# Patient Record
Sex: Male | Born: 1950 | Race: Black or African American | Hispanic: No | Marital: Single | Smoking: Never smoker
Health system: Southern US, Community
[De-identification: ages and names within clinical notes are randomized; demographics above are authoritative.]

## PROBLEM LIST (undated history)

## (undated) DIAGNOSIS — N400 Enlarged prostate without lower urinary tract symptoms: Secondary | ICD-10-CM

## (undated) DIAGNOSIS — F79 Unspecified intellectual disabilities: Secondary | ICD-10-CM

## (undated) DIAGNOSIS — E039 Hypothyroidism, unspecified: Secondary | ICD-10-CM

## (undated) DIAGNOSIS — D649 Anemia, unspecified: Secondary | ICD-10-CM

## (undated) DIAGNOSIS — H547 Unspecified visual loss: Secondary | ICD-10-CM

## (undated) DIAGNOSIS — E785 Hyperlipidemia, unspecified: Secondary | ICD-10-CM

## (undated) DIAGNOSIS — F0390 Unspecified dementia without behavioral disturbance: Secondary | ICD-10-CM

## (undated) DIAGNOSIS — F0280 Dementia in other diseases classified elsewhere without behavioral disturbance: Secondary | ICD-10-CM

## (undated) DIAGNOSIS — E559 Vitamin D deficiency, unspecified: Secondary | ICD-10-CM

## (undated) HISTORY — DX: Hyperlipidemia, unspecified: E78.5

## (undated) HISTORY — DX: Anemia, unspecified: D64.9

## (undated) HISTORY — DX: Dementia in other diseases classified elsewhere, unspecified severity, without behavioral disturbance, psychotic disturbance, mood disturbance, and anxiety: F02.80

## (undated) HISTORY — DX: Unspecified intellectual disabilities: F79

## (undated) HISTORY — DX: Hypothyroidism, unspecified: E03.9

## (undated) HISTORY — DX: Vitamin D deficiency, unspecified: E55.9

## (undated) HISTORY — DX: Unspecified visual loss: H54.7

---

## 2019-12-16 ENCOUNTER — Emergency Department (HOSPITAL_COMMUNITY): Payer: Medicare Other

## 2019-12-16 ENCOUNTER — Encounter (HOSPITAL_COMMUNITY): Payer: Self-pay

## 2019-12-16 ENCOUNTER — Observation Stay (HOSPITAL_COMMUNITY)
Admission: EM | Admit: 2019-12-16 | Discharge: 2019-12-16 | Disposition: A | Discharge status: Skilled Nursing Facility | Payer: Medicare Other | Attending: Family Medicine | Admitting: Family Medicine

## 2019-12-16 ENCOUNTER — Other Ambulatory Visit: Payer: Self-pay

## 2019-12-16 DIAGNOSIS — T43215A Adverse effect of selective serotonin and norepinephrine reuptake inhibitors, initial encounter: Secondary | ICD-10-CM | POA: Insufficient documentation

## 2019-12-16 DIAGNOSIS — Z79890 Hormone replacement therapy (postmenopausal): Secondary | ICD-10-CM | POA: Insufficient documentation

## 2019-12-16 DIAGNOSIS — F0151 Vascular dementia with behavioral disturbance: Secondary | ICD-10-CM | POA: Diagnosis not present

## 2019-12-16 DIAGNOSIS — G9341 Metabolic encephalopathy: Secondary | ICD-10-CM | POA: Diagnosis present

## 2019-12-16 DIAGNOSIS — F0391 Unspecified dementia with behavioral disturbance: Secondary | ICD-10-CM | POA: Diagnosis present

## 2019-12-16 DIAGNOSIS — G2409 Other drug induced dystonia: Secondary | ICD-10-CM | POA: Diagnosis not present

## 2019-12-16 DIAGNOSIS — E039 Hypothyroidism, unspecified: Secondary | ICD-10-CM | POA: Diagnosis present

## 2019-12-16 DIAGNOSIS — Y92129 Unspecified place in nursing home as the place of occurrence of the external cause: Secondary | ICD-10-CM | POA: Insufficient documentation

## 2019-12-16 DIAGNOSIS — G2402 Drug induced acute dystonia: Secondary | ICD-10-CM | POA: Diagnosis not present

## 2019-12-16 DIAGNOSIS — Z79899 Other long term (current) drug therapy: Secondary | ICD-10-CM | POA: Diagnosis not present

## 2019-12-16 DIAGNOSIS — Z20822 Contact with and (suspected) exposure to covid-19: Secondary | ICD-10-CM | POA: Diagnosis not present

## 2019-12-16 DIAGNOSIS — F03918 Unspecified dementia, unspecified severity, with other behavioral disturbance: Secondary | ICD-10-CM | POA: Diagnosis present

## 2019-12-16 DIAGNOSIS — N390 Urinary tract infection, site not specified: Secondary | ICD-10-CM | POA: Diagnosis not present

## 2019-12-16 DIAGNOSIS — H547 Unspecified visual loss: Secondary | ICD-10-CM

## 2019-12-16 DIAGNOSIS — H548 Legal blindness, as defined in USA: Secondary | ICD-10-CM | POA: Diagnosis not present

## 2019-12-16 DIAGNOSIS — R410 Disorientation, unspecified: Secondary | ICD-10-CM

## 2019-12-16 HISTORY — DX: Unspecified dementia, unspecified severity, without behavioral disturbance, psychotic disturbance, mood disturbance, and anxiety: F03.90

## 2019-12-16 HISTORY — DX: Benign prostatic hyperplasia without lower urinary tract symptoms: N40.0

## 2019-12-16 LAB — CBC WITH DIFFERENTIAL/PLATELET
Abs Immature Granulocytes: 0.01 10*3/uL (ref 0.00–0.07)
Basophils Absolute: 0 10*3/uL (ref 0.0–0.1)
Basophils Relative: 0 %
Eosinophils Absolute: 0.1 10*3/uL (ref 0.0–0.5)
Eosinophils Relative: 1 %
HCT: 36.7 % — ABNORMAL LOW (ref 39.0–52.0)
Hemoglobin: 11.2 g/dL — ABNORMAL LOW (ref 13.0–17.0)
Immature Granulocytes: 0 %
Lymphocytes Relative: 19 %
Lymphs Abs: 1 10*3/uL (ref 0.7–4.0)
MCH: 28.8 pg (ref 26.0–34.0)
MCHC: 30.5 g/dL (ref 30.0–36.0)
MCV: 94.3 fL (ref 80.0–100.0)
Monocytes Absolute: 0.4 10*3/uL (ref 0.1–1.0)
Monocytes Relative: 7 %
Neutro Abs: 3.8 10*3/uL (ref 1.7–7.7)
Neutrophils Relative %: 73 %
Platelets: 147 10*3/uL — ABNORMAL LOW (ref 150–400)
RBC: 3.89 MIL/uL — ABNORMAL LOW (ref 4.22–5.81)
RDW: 14.7 % (ref 11.5–15.5)
WBC: 5.3 10*3/uL (ref 4.0–10.5)
nRBC: 0 % (ref 0.0–0.2)

## 2019-12-16 LAB — COMPREHENSIVE METABOLIC PANEL
ALT: 20 U/L (ref 0–44)
AST: 23 U/L (ref 15–41)
Albumin: 4.4 g/dL (ref 3.5–5.0)
Alkaline Phosphatase: 80 U/L (ref 38–126)
Anion gap: 11 (ref 5–15)
BUN: 15 mg/dL (ref 8–23)
CO2: 25 mmol/L (ref 22–32)
Calcium: 9.6 mg/dL (ref 8.9–10.3)
Chloride: 106 mmol/L (ref 98–111)
Creatinine, Ser: 1.27 mg/dL — ABNORMAL HIGH (ref 0.61–1.24)
GFR calc Af Amer: >60 mL/min (ref >60)
GFR calc non Af Amer: 57 mL/min — ABNORMAL LOW (ref >60)
Glucose, Bld: 104 mg/dL — ABNORMAL HIGH (ref 70–99)
Potassium: 4 mmol/L (ref 3.5–5.1)
Sodium: 142 mmol/L (ref 135–145)
Total Bilirubin: 0.6 mg/dL (ref 0.3–1.2)
Total Protein: 7.5 g/dL (ref 6.5–8.1)

## 2019-12-16 LAB — URINALYSIS, ROUTINE W REFLEX MICROSCOPIC
Bilirubin Urine: NEGATIVE
Glucose, UA: NEGATIVE mg/dL
Ketones, ur: NEGATIVE mg/dL
Nitrite: NEGATIVE
Protein, ur: NEGATIVE mg/dL
Specific Gravity, Urine: 1.014 (ref 1.005–1.030)
pH: 5 (ref 5.0–8.0)

## 2019-12-16 LAB — SARS CORONAVIRUS 2 (TAT 6-24 HRS): SARS Coronavirus 2: NEGATIVE

## 2019-12-16 LAB — CBG MONITORING, ED: Glucose-Capillary: 82 mg/dL (ref 70–99)

## 2019-12-16 MED ORDER — QUETIAPINE FUMARATE 25 MG PO TABS: 50.0000 mg | ORAL_TABLET | Freq: Every day | ORAL | Status: DC

## 2019-12-16 MED ORDER — DIPHENHYDRAMINE HCL 12.5 MG/5ML PO ELIX
25.0000 mg | ORAL_SOLUTION | Freq: Once | ORAL | Status: AC
  Administered 2019-12-16: 25 mg via ORAL
  Filled 2019-12-16: qty 10

## 2019-12-16 MED ORDER — QUETIAPINE FUMARATE 25 MG PO TABS: 25.0000 mg | ORAL_TABLET | Freq: Every morning | ORAL | Status: DC

## 2019-12-16 MED ORDER — DIPHENHYDRAMINE HCL 50 MG/ML IJ SOLN
25.0000 mg | Freq: Once | INTRAMUSCULAR | Status: AC
  Administered 2019-12-16: 16:00:00 25 mg via INTRAVENOUS
  Filled 2019-12-16: qty 1

## 2019-12-16 MED ORDER — DIPHENHYDRAMINE HCL 25 MG PO CAPS: 25.0000 mg | ORAL_CAPSULE | Freq: Every day | ORAL | 2 refills | Status: AC

## 2019-12-16 MED ORDER — QUETIAPINE FUMARATE 25 MG PO TABS: 25.0000 mg | ORAL_TABLET | Freq: Every day | ORAL | 0 refills | Status: DC

## 2019-12-16 MED ORDER — SODIUM CHLORIDE 0.9 % IV SOLN
1.0000 g | Freq: Once | INTRAVENOUS | Status: AC
  Administered 2019-12-16: 1 g via INTRAVENOUS
  Filled 2019-12-16: qty 10

## 2019-12-16 MED ORDER — CEPHALEXIN 500 MG PO CAPS: 500.0000 mg | ORAL_CAPSULE | Freq: Three times a day (TID) | ORAL | 0 refills | Status: DC

## 2019-12-16 MED ORDER — ZIPRASIDONE MESYLATE 20 MG IM SOLR: 20.0000 mg | Freq: Once | INTRAMUSCULAR | Status: DC

## 2019-12-16 MED ORDER — SODIUM CHLORIDE 0.9 % IV BOLUS
1000.0000 mL | Freq: Once | INTRAVENOUS | Status: AC
  Administered 2019-12-16: 1000 mL via INTRAVENOUS

## 2019-12-16 NOTE — Clinical Social Work Note (Signed)
Spoke with Medical illustrator at [facility]. Advised that patient was being treated for UTI and would d/c back to facility today with oral abx. At baseline, patient ambulates without assistive device, however staff guides him due to blindness. He feeds himself and is incontinent. He is in the memory care unit.   Candie Gintz, Juleen China, LCSW

## 2019-12-16 NOTE — Consult Note (Signed)
Patient Demographics:    Zachary Blair, is a 69 y.o. male  MRN: [id number]   DOB - 01/24/1951  Admit Date - 12/16/2019  Outpatient Primary MD for the patient is Ward, Molli Hazard, PA-C   Assessment & Plan:    Principal Problem:   Dystonic drug reaction-resolved with Benadryl Active Problems:   Acute metabolic encephalopathy   Acute lower UTI   Dementia with behavioral disturbance (HCC)   Hypothyroidism   Blindness/legally blind    1)Dystonic Reaction--- patient presented with involuntary body movements, eye twitching, postural and gait problems--EDP  did stroke work-up including CT head and brain MRI without acute findings -My evaluation was consistent with patient with dystonic reaction--- so I went ahead and ordered Benadryl IV 25 mg x 1---patient's involuntary movements and eyelid twitching, and postural and gait issues have resovled completely within several minutes of iv Benadryl  -Avoid trazodone, even though it is not usually associated with dystonia patient apparently had a dystonic reaction after having trazodone last night  2)Possible UTI--- patient received IV Rocephin here in the ED, okay to discharge with p.o. Keflex, please see discharge instructions  3)Dementia behavioral disturbance and insomnia--- stop trazodone as outlined above #1, okay to use Benadryl and or Seroquel nightly  Disposition:- 1)Patient has a Dystonic Reaction-- resolved with Benadryl 2) Stroke work-up is negative 3)involuntary movements and eyelid twitching, and postural and gait issues have resovled completely within several minutes of iv Benadryl  4) avoid trazodone 5) may use Seroquel 25 mg nightly for sleep and agitation 6) May give Benadryl 25 mg nightly as needed insomnia or concerns about dystonic reaction 7) patient was  treated for presumed UTI with IV Rocephin in the ED today--May start Keflex antibiotic 500 mg 3 times a day starting 12/17/2019 for 3 days----please call back in 48 hours to get report on urine culture results  With History of - Reviewed by me  Past Medical History:  Diagnosis Date  . Dementia (HCC)   . Enlarged prostate       History reviewed. No pertinent surgical history.    Chief Complaint  Patient presents with  . Altered Mental Status      HPI:    Zachary Blair  is a 69 y.o. male with past medical history relevant for dementia, blindness, hypothyroidism who presents to the ED from memory unit of a facility with concerns about involuntary body movements, twitching and possible altered mentation--- --EDP performed stroke evaluation with negative CT head and brain MRI -Lab work suggestive of a UTI patient received IV Rocephin -- In ED-- My evaluation was consistent with patient with dystonic reaction--- so I went ahead and ordered Benadryl IV 25 mg x 1--- patient's involuntary movements and eyelid twitching, and postural and gait issues have resovled completely within several minutes of iv Benadryl  -Avoid trazodone, even though it is not usually associated with dystonia patient apparently had a dystonic reaction after having trazodone last night  -Unable to  obtain additional history due to underlying dementia patient is a poor historian  [place] called and spoke with [facility] outpatient facility to get additional information    Review of systems:    In addition to the HPI above,   A full Review of  Systems was done, all other systems reviewed are negative except as noted above in HPI , .    Social History:  Reviewed by me    Social History   Tobacco Use  . Smoking status: Not on file  Substance Use Topics  . Alcohol use: Not on file      Family History :  Reviewed by me  Unable to obtain additional history due to underlying dementia patient is  a poor historian   Home Medications:   Prior to Admission medications   Medication Sig Start Date End Date Taking? Authorizing Provider  ALPRAZolam (XANAX) 0.25 MG tablet Take 0.25 mg by mouth daily as needed. Take 1 tablet by mouth 3 times per week 30 minutes before showers. (mon, wed, fri, 8am) 12/08/19  Yes [provider]  D2000 ULTRA STRENGTH 50 MCG (2000 UT) CAPS Take 1 capsule by mouth daily. 11/23/19  Yes [provider]  doxazosin (CARDURA) 4 MG tablet Take 4 mg by mouth at bedtime. 12/08/19  Yes [provider]  ibuprofen (ADVIL) 600 MG tablet Take 600 mg by mouth every 6 (six) hours as needed. 11/23/19  Yes [provider]  levothyroxine (SYNTHROID) 50 MCG tablet Take 50 mcg by mouth daily. 12/08/19  Yes [provider]  loperamide (IMODIUM) 2 MG capsule Take 2 mg by mouth every 8 (eight) hours as needed. 12/08/19  Yes [provider]  MAPAP 500 MG tablet Take 500 mg by mouth every 4 (four) hours as needed. 11/23/19  Yes [provider]  MI-ACID 200-200-20 MG/5ML suspension Take 30 mLs by mouth every 6 (six) hours as needed. 11/28/19  Yes [provider]  MILK OF MAGNESIA 400 MG/5ML suspension Take 30 mLs by mouth at bedtime. 12/08/19  Yes [provider]  Neomycin-Bacitracin-Polymyxin (TRIPLE ANTIBIOTIC) [phone] OINT Apply 1 application topically daily as needed. 11/28/19  Yes [provider]  pravastatin (PRAVACHOL) 80 MG tablet Take 80 mg by mouth at bedtime. 11/28/19  Yes [provider]  ROBAFEN MUCUS/CHEST CONGESTION 200 MG/10ML liquid Take 10 mLs by mouth every 6 (six) hours as needed. 12/08/19  Yes [provider]  sertraline (ZOLOFT) 50 MG tablet Take 75 mg by mouth daily. 09/05/19  Yes [provider]  cephALEXin (KEFLEX) 500 MG capsule Take 1 capsule (500 mg total) by mouth 3 (three) times daily. 12/16/19   Domenic Moras, PA-C  diphenhydrAMINE (BENADRYL ALLERGY) 25 mg capsule  Take 1 capsule (25 mg total) by mouth at bedtime. 12/16/19   Roxan Hockey, MD  QUEtiapine (SEROQUEL) 25 MG tablet Take 1 tablet (25 mg total) by mouth at bedtime. 12/16/19   Domenic Moras, PA-C     Allergies:    No Known Allergies   Physical Exam:   Vitals  Blood pressure 101/60, pulse 99, temperature 98.3 F (36.8 C), temperature source Oral, resp. rate (!) 22, SpO2 100 %.  Physical Examination: General appearance - alert,  and in no distress and emaciated appearing Mental status -patient is able to follow commands consistently Eyes -legally blind Neck - supple, no JVD elevation , Chest - clear  to auscultation bilaterally, symmetrical air movement,  Heart - S1 and S2 normal, regular  Abdomen -  soft, nontender, nondistended,  Neurological - patient's involuntary movements and eyelid twitching, and postural and gait issues have resovled completely within several minutes of iv Benadryl  -No new focal neurological deficits, patient is able to follow commands, moving all extremities well, gait is steady Extremities - no pedal edema noted, intact peripheral pulses  Skin - warm, dry     Data Review:    CBC Recent Labs  Lab 12/16/19 0900  WBC 5.3  HGB 11.2*  HCT 36.7*  PLT 147*  MCV 94.3  MCH 28.8  MCHC 30.5  RDW 14.7  LYMPHSABS 1.0  MONOABS 0.4  EOSABS 0.1  BASOSABS 0.0   ------------------------------------------------------------------------------------------------------------------  Chemistries  Recent Labs  Lab 12/16/19 0900  NA 142  K 4.0  CL 106  CO2 25  GLUCOSE 104*  BUN 15  CREATININE 1.27*  CALCIUM 9.6  AST 23  ALT 20  ALKPHOS 80  BILITOT 0.6   ------------------------------------------------------------------------------------------------------------------ CrCl cannot be calculated (Unknown ideal weight.). ------------------------------------------------------------------------------------------------------------------ No results for  input(s): TSH, T4TOTAL, T3FREE, THYROIDAB in the last 72 hours.  Invalid input(s): FREET3   Coagulation profile No results for input(s): INR, PROTIME in the last 168 hours. ------------------------------------------------------------------------------------------------------------------- No results for input(s): DDIMER in the last 72 hours. -------------------------------------------------------------------------------------------------------------------  Cardiac Enzymes No results for input(s): CKMB, TROPONINI, MYOGLOBIN in the last 168 hours.  Invalid input(s): CK ------------------------------------------------------------------------------------------------------------------ No results found for: BNP   ---------------------------------------------------------------------------------------------------------------  Urinalysis    Component Value Date/Time   COLORURINE YELLOW 12/16/2019 0849   APPEARANCEUR HAZY (A) 12/16/2019 0849   LABSPEC 1.014 12/16/2019 0849   PHURINE 5.0 12/16/2019 0849   GLUCOSEU NEGATIVE 12/16/2019 0849   HGBUR MODERATE (A) 12/16/2019 0849   BILIRUBINUR NEGATIVE 12/16/2019 0849   KETONESUR NEGATIVE 12/16/2019 0849   PROTEINUR NEGATIVE 12/16/2019 0849   NITRITE NEGATIVE 12/16/2019 0849   LEUKOCYTESUR SMALL (A) 12/16/2019 0849   ----------------------------------------------------------------------------------------------------------------   Imaging Results:    CT Head Wo Contrast  Result Date: 12/16/2019 CLINICAL DATA:  Left-sided weakness EXAM: CT HEAD WITHOUT CONTRAST TECHNIQUE: Contiguous axial images were obtained from the base of the skull through the vertex without intravenous contrast. COMPARISON:  None. FINDINGS: Brain: No evidence of acute infarction, hemorrhage, hydrocephalus, extra-axial collection or mass lesion/mass effect. Vascular: No hyperdense vessel or unexpected calcification. Skull: Normal. Negative for fracture or focal lesion.  Sinuses/Orbits: No acute finding. Other: None. IMPRESSION: Normal head CT for age Electronically Signed   By: Alcide Clever M.D.   On: 12/16/2019 09:48   MR BRAIN WO CONTRAST  Result Date: 12/16/2019 CLINICAL DATA:  Altered mental status EXAM: MRI HEAD WITHOUT CONTRAST TECHNIQUE: Multiplanar, multiecho pulse sequences of the brain and surrounding structures were obtained without intravenous contrast. COMPARISON:  Head CT December 16, 2019 FINDINGS: Essentially nondiagnostic images of the brain were obtained due to patient inability to lay still in the scanner. No large intracranial mass, hemorrhage or midline shift. Repeat scan under sedation is suggested if clinically appropriate. IMPRESSION: Essentially nondiagnostic images of the brain due to patient inability to lay still in the scanner. Electronically Signed   By: Baldemar Lenis M.D.   On: 12/16/2019 14:14   Radiological Exams on Admission: CT Head Wo Contrast  Result Date: 12/16/2019 CLINICAL DATA:  Left-sided weakness EXAM: CT HEAD WITHOUT CONTRAST TECHNIQUE: Contiguous axial images were obtained from the base of the skull through the vertex without intravenous contrast. COMPARISON:  None. FINDINGS: Brain: No evidence of acute infarction, hemorrhage, hydrocephalus, extra-axial collection or mass lesion/mass effect. Vascular:  No hyperdense vessel or unexpected calcification. Skull: Normal. Negative for fracture or focal lesion. Sinuses/Orbits: No acute finding. Other: None. IMPRESSION: Normal head CT for age Electronically Signed   By: Alcide Clever M.D.   On: 12/16/2019 09:48   MR BRAIN WO CONTRAST  Result Date: 12/16/2019 CLINICAL DATA:  Altered mental status EXAM: MRI HEAD WITHOUT CONTRAST TECHNIQUE: Multiplanar, multiecho pulse sequences of the brain and surrounding structures were obtained without intravenous contrast. COMPARISON:  Head CT December 16, 2019 FINDINGS: Essentially nondiagnostic images of the brain were obtained due  to patient inability to lay still in the scanner. No large intracranial mass, hemorrhage or midline shift. Repeat scan under sedation is suggested if clinically appropriate. IMPRESSION: Essentially nondiagnostic images of the brain due to patient inability to lay still in the scanner. Electronically Signed   By: Baldemar Lenis M.D.   On: 12/16/2019 14:14   AM Labs Ordered, also please review Full Orders  Family Communication: Admission, patients condition and plan of care including tests being ordered have been discussed with  Shavon at facility who indicate understanding and agree with the plan   Code Status - Full Code  Likely DC to back to memory care unit  Condition   stable with resolved dystonic reaction  Shon Hale M.D on 12/16/2019 at 5:32 PM Go to [email or web address] -  for contact info  [facility] - Office  [phone]

## 2019-12-16 NOTE — ED Provider Notes (Signed)
[facility] Provider Note   CSN: [id number] Arrival date & time: 12/16/19  6010     History Chief Complaint  Patient presents with  . Altered Mental Status    Zachary Blair is a 69 y.o. male.  The history is provided by the nursing home and the EMS personnel. No language interpreter was used.  Altered Mental Status    69 year old male presented to ED via EMS for evaluation of altered mental status. Pt has history of vascular dementia, currently reside at a nursing facility.  This morning when staff checked up on him, patient was found to be laying in bed, with his head tilted to the left, and exhibits strange movement of his left arm.  This is new.  His last known normal that was documented was yesterday morning.  At baseline, patient is able to ambulate, feed himself and able to communicate.  No recent sickness or sick symptoms were noted.  Patient was recently started on trazodone last night to help him sleep.  First dose was last night.  No prior stroke.  No recent COVID-19 outbreak in the facility.  Patient is full code. No hx of seizures.  EMS mentioned that patient was leaning his head more towards the right when they brought him here.  Nursing noted that patient was moving his head towards the left when he arrived.    Past Medical History:  Diagnosis Date  . Dementia ([place])   . Enlarged prostate     There are no problems to display for this patient.   The histories are not reviewed yet. Please review them in the "History" navigator section and refresh this [place].     No family history on file.  Social History   Tobacco Use  . Smoking status: Not on file  Substance Use Topics  . Alcohol use: Not on file  . Drug use: Not on file    Home Medications Prior to Admission medications   Not on File    Allergies    Patient has no known allergies.  Review of Systems   Review of Systems  Unable to perform ROS: Dementia    Physical  Exam Updated Vital Signs BP 121/69 (BP Location: Left Arm)   Pulse 89   Temp 98.3 F (36.8 C) (Oral)   Resp 20   SpO2 100%   Physical Exam Vitals and nursing note reviewed.  Constitutional:      General: He is not in acute distress.    Appearance: He is well-developed.     Comments: Elderly male, frail appearing, blind, appears to be in no acute distress.  HENT:     Head: Atraumatic.  Eyes:     Comments: Right eye with opaque lens.  Left pupil is reactive.  Neck:     Comments: No nuchal rigidity Cardiovascular:     Rate and Rhythm: Normal rate and regular rhythm.     Pulses: Normal pulses.     Heart sounds: Normal heart sounds.  Pulmonary:     Breath sounds: Normal breath sounds. No wheezing, rhonchi or rales.  Abdominal:     Palpations: Abdomen is soft.     Tenderness: There is no abdominal tenderness.  Musculoskeletal:     Cervical back: Normal range of motion and neck supple.     Comments: On exam, patient touches his head to the left but able to move it to the right.  Left arm is drawn up but able to grip finger.  Able to move all 4 extremities to command.  Skin:    Findings: No rash.  Neurological:     Mental Status: He is alert.     Comments: Neurologic exam:  Speech garble, does follows command Blindness Mild right facial droop Follows commands, moves all extremities x4, Decreased L grip strength compared to right Sensation intact Coordination not tested Gait not tested Alert but unable to tell name, situation, place, or time  Psychiatric:        Mood and Affect: Mood normal.     ED Results / Procedures / Treatments   Labs (all labs ordered are listed, but only abnormal results are displayed) Labs Reviewed  CBC WITH DIFFERENTIAL/PLATELET - Abnormal; Notable for the following components:      Result Value   RBC 3.89 (*)    Hemoglobin 11.2 (*)    HCT 36.7 (*)    Platelets 147 (*)    All other components within normal limits  COMPREHENSIVE METABOLIC  PANEL - Abnormal; Notable for the following components:   Glucose, Bld 104 (*)    Creatinine, Ser 1.27 (*)    GFR calc non Af Amer 57 (*)    All other components within normal limits  URINALYSIS, ROUTINE W REFLEX MICROSCOPIC - Abnormal; Notable for the following components:   APPearance HAZY (*)    Hgb urine dipstick MODERATE (*)    Leukocytes,Ua SMALL (*)    Bacteria, UA RARE (*)    All other components within normal limits  SARS CORONAVIRUS 2 (TAT 6-24 HRS)  URINE CULTURE  CBG MONITORING, ED    EKG None  Radiology CT Head Wo Contrast  Result Date: 12/16/2019 CLINICAL DATA:  Left-sided weakness EXAM: CT HEAD WITHOUT CONTRAST TECHNIQUE: Contiguous axial images were obtained from the base of the skull through the vertex without intravenous contrast. COMPARISON:  None. FINDINGS: Brain: No evidence of acute infarction, hemorrhage, hydrocephalus, extra-axial collection or mass lesion/mass effect. Vascular: No hyperdense vessel or unexpected calcification. Skull: Normal. Negative for fracture or focal lesion. Sinuses/Orbits: No acute finding. Other: None. IMPRESSION: Normal head CT for age Electronically Signed   By: Alcide Clever M.D.   On: 12/16/2019 09:48    Procedures Procedures (including critical care time)  Medications Ordered in ED Medications  cefTRIAXone (ROCEPHIN) 1 g in sodium chloride 0.9 % 100 mL IVPB (has no administration in time range)  sodium chloride 0.9 % bolus 1,000 mL (0 mLs Intravenous Stopped 12/16/19 1134)    ED Course  I have reviewed the triage vital signs and the nursing notes.  Pertinent labs & imaging results that were available during my care of the patient were reviewed by me and considered in my medical decision making (see chart for details).    MDM Rules/Calculators/A&P                      BP 121/69 (BP Location: Left Arm)   Pulse 89   Temp 98.3 F (36.8 C) (Oral)   Resp 20   SpO2 100%   Final Clinical Impression(s) / ED Diagnoses Final  diagnoses:  Delirium  Lower urinary tract infectious disease    Rx / DC Orders ED Discharge Orders         Ordered    cephALEXin (KEFLEX) 500 MG capsule  3 times daily     12/16/19 1526         9:08 AM Elderly demented male brought here for evaluation of altered mental status.  Patient  was found to be more confused than his baseline with his left arm drawn upward and head tilted to the left this morning, but per EMS, head was tilted more to the right, and on our exam, his head is tilted to the left.  He follows some commands and able to move all 4 extremities.  He is however not at his baseline according to staff with last known normal yesterday morning.  Work-up initiated.  Care discussed with Dr. Jodi Mourning.   I did called and spoke with pt's nurse at his facility to obtain history.  Pt was given Trazodone last night to help with sleep, this is a new medication.   11:21 AM Head CT scan is unremarkable, labs are mostly reassuring, mild AKI with a creatinine of 1.27.  UA is currently pending.  IV fluid given.  Given altered mental status, some localized focal neuro changes the patient will benefit from hospital admission for further work-up which may include MRI.  He is outside of the stroke treatment window.  12:49 PM UA shows moderate hemoglobin and urine dipsticks as well as 21-50 WBC suggestive of an underlying UTI.  Urine culture sent, Rocephin started as treatment for UTI as it may contribute to patient's altered mental status/delirium.  Will consult for admission.  COVID-19 test ordered.  1:25 PM Appreciate consultation from [facility] Dr. Marisa Severin who agrees to admit pt but recommend brain MRI obtained to r/o acute stroke. MRI ordered.   2:55 PM Brain MRI was obtained showing no large intracranial mass, hemorrhage or midline shift. However this is essentially non diagnostic due to patient's inability to lay still in the scanner.  I did discuss this with Dr. Jodi Mourning who recommend  having pt admitted and the admitting team may decide if pt needs further MRI imaging under sedation.   3:21 PM [facility] Dr. Marisa Severin has seen and evaluated pt.  He also reached out to pt's living facility.  At this time, he recommend discharging pt back to the facility with keflex as treatment for UTI, since pt does not have an elevated white count or fever.  He does not think additional MRI is indicated.  He will place a full consult note.  Will discharge pt per recommendation of our hospitalist.    Zachary Blair was evaluated in Emergency Department on 12/16/2019 for the symptoms described in the history of present illness. He was evaluated in the context of the global COVID-19 pandemic, which necessitated consideration that the patient might be at risk for infection with the SARS-CoV-2 virus that causes COVID-19. Institutional protocols and algorithms that pertain to the evaluation of patients at risk for COVID-19 are in a state of rapid change based on information released by regulatory bodies including the CDC and federal and state organizations. These policies and algorithms were followed during the patient's care in the ED.    Fayrene Helper, PA-C 12/16/19 1526    Blane Ohara, MD 12/16/19 1538

## 2019-12-16 NOTE — Discharge Instructions (Addendum)
Your confusion is likely due to an underlying urinary tract infection.  Take antibiotic as prescribed and follow up closely with your doctor for further care. Avoid taking Trazodone as it may cause some of your current symptoms.  Take Seroquel nightly instead for sleep  - 1) patient has a dystonic reaction-- resolved with Benadryl 2) stroke work-up is negative 3)involuntary movements and eyelid twitching, and postural and gait issues have resovled completely within several minutes of iv Benadryl  4) avoid trazodone 5) may use Seroquel 25 mg nightly for sleep and agitation 6) May give Benadryl 25 mg nightly as needed insomnia or concerns about dystonic reaction 7) patient was treated for presumed UTI with IV Rocephin in the ED today--May start Keflex antibiotic 500 mg 3 times a day starting 12/17/2019 for 3 days----please call back in 48 hours to get report on urine culture results

## 2019-12-16 NOTE — ED Notes (Signed)
Upon return to [facility], discharge paperwork with patient refers to prescription meds that were transmitted to [facility] pharmacy in [place].  Pt's Nurse Efraim Kaufmann was not sure if she would receive pt's meds this weekend in order to start antibiotics.  Pt had received rocephin here prior to discharge so antibiotics should be started soon.   Melissa will let us know if she does not receive meds this weekend in case another means is required in order to get RX's filled.

## 2019-12-16 NOTE — ED Notes (Signed)
CBG 102 per ems.

## 2019-12-16 NOTE — ED Notes (Signed)
Involuntary movements, eye twitching, facial twitching have resolved.

## 2019-12-16 NOTE — ED Triage Notes (Signed)
EMS reports pt is a resident at [facility].  Reports history of dementia.  Reports pt answers yes and no questions and is almost completely blind.  Staff told ems that pt was leaning to the left in his wheelchair this morning.  KNW unknown.  Says that Reports pt usually ambulatory.  EMS says pt stood for them but they didn't want him to try to walk.  Pt moving all extremities but now  Pt looking to left side and left arm twitching.  EDP aware.  EMS reports when they arrived pt was leaning towards the right side.

## 2019-12-16 NOTE — ED Notes (Signed)
[facility] called for transport to [facility]

## 2019-12-16 NOTE — ED Notes (Signed)
Patient has pulled on lines and pulled all line off. Patient has pulled IV out and is restless. Patient was given a dementia busy blanket.

## 2019-12-18 LAB — URINE CULTURE: Culture: >=100000 — AB

## 2019-12-19 ENCOUNTER — Telehealth: Payer: Self-pay | Admitting: *Deleted

## 2019-12-19 NOTE — Telephone Encounter (Signed)
Post ED Visit - Positive Culture Follow-up  Culture report reviewed by antimicrobial stewardship pharmacist: [facility] []  , Pharm.D. []  Enzo Bi, Pharm.D., BCPS AQ-ID []  , Pharm.D., BCPS []  Celedonio Miyamoto, Pharm.D., BCPS []  [place], Garvin Fila.D., BCPS, AAHIVP []  , Pharm.D., BCPS, AAHIVP [x]  Georgina Pillion, PharmD, BCPS []  , PharmD, BCPS []  [place], PharmD, BCPS []  [address], PharmD []  , PharmD, BCPS []  Estella Husk, PharmD  Pharmacy Team []  Lysle Pearl, PharmD []  , PharmD []  Phillips Climes, PharmD []  , Rph []  Agapito Games) , PharmD []  Verlan Friends, PharmD []  , PharmD []  Mervyn Gay, PharmD []  , PharmD []  Vinnie Level, PharmD []  [facility], PharmD []  , PharmD []  Len Childs, PharmD   Positive urine culture Treated with Cephalexin, organism sensitive to the same and no further patient follow-up is required at this time.  [facility] 12/19/2019, 8:50 AM

## 2020-04-26 ENCOUNTER — Encounter: Payer: Self-pay | Admitting: Internal Medicine

## 2020-06-21 NOTE — Progress Notes (Signed)
Referring Provider: Jean Rosenthal  Primary Care Physician:  Braxton Feathers Primary Gastroenterologist:  Dr. Abbey Chatters  Chief Complaint  Patient presents with  . Colonoscopy    HPI:   Zachary Blair is a 69 y.o. male presenting today at the request of [facility] for consult colonoscopy.   Reviewed information provided and referral.  The encounter that was sent with referral was dated 04/17/2020.  Patient was being evaluated for malodor of his urine.  Plans for UA.  States refer to GI for colonoscopy but no indication was provided.  UA results were not provided.   Patient resides at [facility] and presents with aid today. He is very figidity, doesn't stay seated, doesn't redirect well. Seems agitated and speaks aggressively when asking if he will sit back in the chair. Refuses to wear mask.   Patient unable to provide any meaningful history today.  His aid is present with him today but does not know why he is being seen today. Aid states that patient is incontinent, but she does not know what his BMs are like as she does not typically care for him.  Reports being told that his BMs are "bad" and he needs to be evaluated.  Not sure if he has ever had a colonoscopy.  States he does not complain of abdominal pain or nausea.  No known vomiting. He is eating well. Notably, he is under weight with BMI 17.75.   Spoke with patient's nurse at [facility] on 8/24 as I was unable to reach anyone on 8/20 : 2 weeks ago, patient had watery diarrhea for about 1.5 weeks with 4 BMs per day. No blood in the stool. Stool was very dark/black but states they have always been dark on iron, so this wasn't a change. He has been on iron for several months. No other residents with similar symptoms. Symptoms are improving on their own. Currently with 2 mushy BMs a day. No associated fever. He continues eating well. No vomiting. Didn't complain of abdominal pain. Prior to 2 weeks ago, he was having soft formed  stools daily. No weight loss.   When discussing with nurse whether patient would bee cooperative to complete a bowel prep, they state there are a few employees there that the patient knows well and cooperates for, so they would try to have the, help the patient with the prep and accompany him on the day of a procedure.   I requested recent labs which are listed below. (see labs)  I asked to speak with the nurse practitioner at the nursing facility. She was not available. I left a message requesting a call back. Called again 8/25, but she had already left for the day, She returned call on 8/26 while I was seeing patient. Tried to call back and left a VM requesting a return call.   Per nursing staff at [facility], patients POA is Cristina Gong.   Past Medical History:  Diagnosis Date  . Alzheimer's dementia ([place])   . Anemia   . Blindness   . Dementia ([place])   . Enlarged prostate   . HLD (hyperlipidemia)   . Hypothyroidism   . Intellectual disability   . Vitamin D deficiency     History reviewed. No pertinent surgical history.  Current Outpatient Medications  Medication Sig Dispense Refill  . ALPRAZolam (XANAX) 0.25 MG tablet Take 0.25 mg by mouth daily as needed. Take 1 tablet by mouth 3 times per week 30 minutes before showers. (mon,  wed, fri, 8am)    . D2000 ULTRA STRENGTH 50 MCG (2000 UT) CAPS Take 1 capsule by mouth daily.    . divalproex (DEPAKOTE) 125 MG DR tablet Take 125 mg by mouth in the morning and at bedtime.    Marland Kitchen doxazosin (CARDURA) 4 MG tablet Take 2 mg by mouth at bedtime. Takes 2 mg at bedtime.    . hydrOXYzine (ATARAX/VISTARIL) 25 MG tablet Take 25 mg by mouth at bedtime.    . Iron, Ferrous Sulfate, 325 (65 Fe) MG TABS Take by mouth daily.    Marland Kitchen levothyroxine (SYNTHROID) 50 MCG tablet Take 50 mcg by mouth daily.    Marland Kitchen loperamide (IMODIUM) 2 MG capsule Take 2 mg by mouth as needed.     Marland Kitchen MAPAP 500 MG tablet Take 500 mg by mouth every 4 (four) hours as needed.    Marland Kitchen  MI-ACID 200-200-20 MG/5ML suspension Take 30 mLs by mouth every 6 (six) hours as needed.    Marland Kitchen MILK OF MAGNESIA 400 MG/5ML suspension Take 30 mLs by mouth at bedtime.    Marland Kitchen Neomycin-Bacitracin-Polymyxin (TRIPLE ANTIBIOTIC) [phone] OINT Apply 1 application topically daily as needed.    . pravastatin (PRAVACHOL) 80 MG tablet Take 80 mg by mouth at bedtime.    Marland Kitchen ROBAFEN MUCUS/CHEST CONGESTION 200 MG/10ML liquid Take 10 mLs by mouth every 6 (six) hours as needed.    . sertraline (ZOLOFT) 50 MG tablet Take 100 mg by mouth daily. Takes 100 mg daily.    . traZODone (DESYREL) 50 MG tablet Take 50 mg by mouth at bedtime.    . diphenhydrAMINE (BENADRYL ALLERGY) 25 mg capsule Take 1 capsule (25 mg total) by mouth at bedtime. 30 capsule 2  . ibuprofen (ADVIL) 600 MG tablet Take 600 mg by mouth every 6 (six) hours as needed. (Patient not taking: Reported on 06/22/2020)     No current facility-administered medications for this visit.    Allergies as of 06/22/2020  . (No Known Allergies)    History reviewed. No pertinent family history.  Social History   Socioeconomic History  . Marital status: Single    Spouse name: Not on file  . Number of children: Not on file  . Years of education: Not on file  . Highest education level: Not on file  Occupational History  . Not on file  Tobacco Use  . Smoking status: Never Smoker  . Smokeless tobacco: Never Used  Substance and Sexual Activity  . Alcohol use: Not Currently  . Drug use: Not Currently  . Sexual activity: Not on file  Other Topics Concern  . Not on file  Social History Narrative  . Not on file   Social Determinants of Health   Financial Resource Strain:   . Difficulty of Paying Living Expenses: Not on file  Food Insecurity:   . Worried About Charity fundraiser in the Last Year: Not on file  . Ran Out of Food in the Last Year: Not on file  Transportation Needs:   . Lack of Transportation (Medical): Not on file  . Lack of  Transportation (Non-Medical): Not on file  Physical Activity:   . Days of Exercise per Week: Not on file  . Minutes of Exercise per Session: Not on file  Stress:   . Feeling of Stress : Not on file  Social Connections:   . Frequency of Communication with Friends and Family: Not on file  . Frequency of Social Gatherings with Friends and Family: Not on  file  . Attends Religious Services: Not on file  . Active Member of Clubs or Organizations: Not on file  . Attends Archivist Meetings: Not on file  . Marital Status: Not on file  Intimate Partner Violence:   . Fear of Current or Ex-Partner: Not on file  . Emotionally Abused: Not on file  . Physically Abused: Not on file  . Sexually Abused: Not on file    Review of Systems: Unable to assess other than what is mentioned in HPI due to dementia.  Gen: Denies any fever, chills, fatigue, weight loss, lack of appetite.  CV: Denies chest pain, heart palpitations, peripheral edema, syncope.  Resp: Denies shortness of breath at rest or with exertion. Denies wheezing or cough.  GI: Denies dysphagia or odynophagia. Denies jaundice, hematemesis, fecal incontinence. GU : Denies urinary burning, urinary frequency, urinary hesitancy MS: Denies joint pain, muscle weakness, cramps, or limitation of movement.  Derm: Denies rash, itching, dry skin Psych: Denies depression, anxiety, memory loss, and confusion Heme: Denies bruising, bleeding, and enlarged lymph nodes.  Physical Exam: BP (!) 98/59   Pulse 82   Temp (!) 97.4 F (36.3 C) (Oral)   Ht _0  (1.626 m)   Wt 103 lb 6.4 oz (46.9 kg)   BMI 17.75 kg/m   PE is very limited due to patient being uncooperative and refusing exam.  General:   Alert. Uncooperative. Unsettled. Getting in and out of chair, will not keep mask on. Doesn't respond to redirection well. Thin.  Head:  Normocephalic and atraumatic. Eyes: Unable to assess as he will not look at me.  Lungs:  Clear to auscultation  bilaterally. No wheezes, rales, or rhonchi. No distress.  Heart:  S1, S2 present without murmurs appreciated.  Abdomen: Unable to assess. Patient refused exam.  Msk:  Symmetrical without gross deformities. Normal posture. Extremities:  Unable to assess.  Neurologic: Alert. Patient unable to get any responses from patient. Unable to tell me his name or date of birth.  Psych: Flat affect. Becomes defensive when trying to redirect.   Labs: 04/06/2020 CBC: WBC 5.5, hemoglobin 10.8 (L), MCV 88.9, MCH 28.6, MCHC 32.2, platelets 149 CMP glucose 84, sodium 141, potassium 3.8, chloride 101, creatinine 1.3, alk phos 85, ALT 8, AST 10, albumin 4.2, total bilirubin 0.4 Iron 31 (L) Vitamin B12 348 Folate 14.4 Vitamin D 25 hydroxy 69.1  TSH 1.84

## 2020-06-22 ENCOUNTER — Ambulatory Visit (INDEPENDENT_AMBULATORY_CARE_PROVIDER_SITE_OTHER): Payer: Medicare Other | Admitting: Gastroenterology

## 2020-06-22 ENCOUNTER — Encounter: Payer: Self-pay | Admitting: Gastroenterology

## 2020-06-22 ENCOUNTER — Other Ambulatory Visit: Payer: Self-pay

## 2020-06-22 DIAGNOSIS — D649 Anemia, unspecified: Secondary | ICD-10-CM | POA: Diagnosis not present

## 2020-06-22 DIAGNOSIS — Z1211 Encounter for screening for malignant neoplasm of colon: Secondary | ICD-10-CM | POA: Diagnosis not present

## 2020-06-22 NOTE — Patient Instructions (Signed)
We will reach out to your nursing staff at [facility] to discuss your symptoms further and make a decision at that time as to whether a colonoscopy is needed.   Ermalinda Memos, PA-C [facility]

## 2020-06-26 ENCOUNTER — Encounter: Payer: Self-pay | Admitting: Gastroenterology

## 2020-06-27 ENCOUNTER — Telehealth: Payer: Self-pay | Admitting: Internal Medicine

## 2020-06-27 NOTE — Telephone Encounter (Signed)
Pt was seen in the office on 8/20 by Ermalinda Memos, PA. His sister, Bertram Savin, is calling asking what all was discussed during the office visit and what was the plan of care. [phone]

## 2020-06-27 NOTE — Telephone Encounter (Addendum)
Called sister back and informed her that we did not have a release of information on file so unfortunately, I can not release any medical information.  She said that he stays at [facility] so I advised her to give them a call and they may be able to release information to her.

## 2020-06-28 ENCOUNTER — Telehealth: Payer: Self-pay | Admitting: Internal Medicine

## 2020-06-28 NOTE — Telephone Encounter (Signed)
[name], PA from the [facility] was calling to speak with Zachary Memos, PA about patient. I told her that [name] had left for the day and I would let her know that she had called. Please call [phone]

## 2020-06-29 NOTE — Telephone Encounter (Signed)
Tried to call [name] today on [phone] and left a VM. Also called [facility] and was told [name] was not there today.

## 2020-07-01 ENCOUNTER — Telehealth: Payer: Self-pay | Admitting: Gastroenterology

## 2020-07-01 ENCOUNTER — Encounter: Payer: Self-pay | Admitting: Gastroenterology

## 2020-07-01 DIAGNOSIS — D649 Anemia, unspecified: Secondary | ICD-10-CM | POA: Insufficient documentation

## 2020-07-01 NOTE — Assessment & Plan Note (Addendum)
69 year old male with history of dementia with behavioral disturbances, hypothyroidism, HLD, and blindness with hemoglobin of 10.8 with normocytic indices on 04/06/2020.  Hemoglobin was 11.2 in February 2021.  Additional labs in June with iron 31 (L), vitamin B12 348, folate 14.4.  He is currently on oral iron and stools are dark/black.  No bright red blood per rectum.  No known significant upper GI symptoms; however, with dementia, patient is not able to provide any history.  Per nursing staff, he eats well, does not complain of any pain, and has no vomiting.  I have been unable to speak with the nurse practitioner at [facility], but per nursing staff, they do not think patient has ever had a colonoscopy which was the reason for his visit today.   With anemia and low iron, we are likely going to need to pursue TCS and EGD. I will update labs, plan to discuss with patients POA, and hope to talk with the NP at [facility] as well for any additional information. Of note, patient is not cooperative during today's visit, and I am not sure he will complete a bowel prep or cooperate for procedures.   Plan:  Update CBC and iron panel with ferritin.  Will continue trying to reach NP at [facility] Will discuss further with patients POA once labs result.

## 2020-07-01 NOTE — Telephone Encounter (Signed)
Angie, can you arrange for patient to have CBC and iron panel with ferritin completed? Dx: Anemia.   Patient resides at [facility].

## 2020-07-01 NOTE — Assessment & Plan Note (Addendum)
69 year old male with history of dementia with behavioral disturbances, hypothyroidism, HLD, and blindness presenting today at the request of [facility] for consult colonoscopy.  Per nursing staff at [facility], patient has not had a colonoscopy.  Reviewed labs completed June 2021 revealing hemoglobin 10.8 (L) with normocytic indices, iron 31 (L), vitamin B12 348, folate 14.4.  History is obtained from nursing staff as patient is unable to provide any history.  Nursing staff report patient was experiencing watery stools about 2 weeks ago lasting for 1.5 weeks but this is improving, currently with 2 mushy BMs daily.  No BRBPR.  Stools are dark/black on iron which is not new.  Notably, patient is not cooperative during today's visit and I am not sure he would be able to complete a bowel prep or cooperate for a colonoscopy. I have tried to contact the NP at [facility], but have not been able to reach her. If patient has true IDA, we will likely need to pursue TCS/EGD.   Plan:  Update CBC and iron panel with ferritin.  Will continue to try to reach NP at [facility] to obtain additional information.  Will reach out to patient's POA to discuss once labs result.  Further recommendations to follow.

## 2020-07-02 ENCOUNTER — Other Ambulatory Visit: Payer: Self-pay | Admitting: *Deleted

## 2020-07-02 DIAGNOSIS — D649 Anemia, unspecified: Secondary | ICD-10-CM

## 2020-07-02 NOTE — Telephone Encounter (Addendum)
Spoke to [employer] at [facility].  She said that they have a person from [facility] that draws labs on Thursdays.  She is aware that I will fax over the lab order.  She took down my fax number so she could fax [place] results.    FYI to Ermalinda Memos, PA:  Joyce Gross scheduled pt for ov on 08/29/20.  She said that pt's sister would be coming with the pt.

## 2020-07-07 NOTE — Telephone Encounter (Signed)
Noted  

## 2020-07-11 NOTE — Telephone Encounter (Signed)
Angie, can we follow-up on patient having labs drawn? I have not received results.

## 2020-07-13 NOTE — Telephone Encounter (Signed)
Called and requested.  Joyce Gross from [facility] faxed over.  Placed in your box for review.

## 2020-07-15 NOTE — Telephone Encounter (Signed)
Received and reviewed labs dated 07/06/2020.  CBC: WBC 3.3 (L), hemoglobin 11.3 (L), hematocrit 34.5 (L), MCV 89.6, MCH 29.2, MCHC 32.6, platelets 138 (L) Iron panel: Iron 72, iron saturation 30%, TIBC 244  Hemoglobin has improved to 11.3 from 10.8 in June 2021.  Iron has improved to 72 from 31 in June 2021.  He is on oral iron.  I am not sure exactly when this was started.   Considering anemia at least with what appeared to be possible iron deficiency in June 2021, would need to consider EGD and colonoscopy.  It is encouraging that hemoglobin is improving with oral iron.  Would like to update ferritin for completeness.  Notably, he has pancytopenia with low white blood cell count and low platelets.  May need to consider hematology referral.  Would also need some abdominal imaging to evaluate for possible cirrhosis. Not sure if patient would be cooperative for abdominal imaging. This is a bit of a tricky situation considering dementia with behavioral disturbances.  Recommendations:  Check ferritin.  Will discuss further with Dr. Marletta Lor.  Pending discussion with Dr. Marletta Lor, will likely go ahead and reach out to patient's POA  to discuss.

## 2020-07-16 ENCOUNTER — Other Ambulatory Visit: Payer: Self-pay | Admitting: *Deleted

## 2020-07-16 DIAGNOSIS — D649 Anemia, unspecified: Secondary | ICD-10-CM

## 2020-07-16 NOTE — Telephone Encounter (Signed)
Called [facility] and spoke to pt's nurse.  Informed her of Ermalinda Memos, PA's recommendations.  She was made aware that pt would need Ferritin to complete work up.  She informed me that [facility] would do blood draw.  Faxing over lab work order.  She requested that I fax over the information discussed by phone.  Faxed accordingly.

## 2020-07-20 ENCOUNTER — Telehealth: Payer: Self-pay | Admitting: Gastroenterology

## 2020-07-20 ENCOUNTER — Other Ambulatory Visit: Payer: Self-pay | Admitting: *Deleted

## 2020-07-20 DIAGNOSIS — D696 Thrombocytopenia, unspecified: Secondary | ICD-10-CM

## 2020-07-20 DIAGNOSIS — R195 Other fecal abnormalities: Secondary | ICD-10-CM

## 2020-07-20 DIAGNOSIS — K625 Hemorrhage of anus and rectum: Secondary | ICD-10-CM

## 2020-07-20 DIAGNOSIS — K6289 Other specified diseases of anus and rectum: Secondary | ICD-10-CM

## 2020-07-20 MED ORDER — HYDROCORTISONE (PERIANAL) 2.5 % EX CREA: 1.0000 "application " | TOPICAL_CREAM | Freq: Two times a day (BID) | CUTANEOUS | 1 refills | Status: AC

## 2020-07-20 NOTE — Telephone Encounter (Signed)
I was able to connect with Joyce Gross who is the patient's nurse at [facility] house.  She reports patient is currently having 3-4 watery BMs daily.  It has been present since August of this year.  Also reports occasional low-volume hematochezia.  No melena.  Reports when patient has been white, he has started to use a collar.  States there is something protruding out below for the hemorrhoid.  This started about 1 month ago.  No obvious abdominal pain.  No vomiting.  He is eating well.  States he is always combative when not trying to do something with him.  Reports he was hospitalized in July with colitis and sepsis and was also diagnosed with C. difficile at that point.  He has not had any stool studies completed since his diarrhea returned in August.  He is taking at least 1 Imodium daily.   Reports patient's POA is Thelbert Gartin (sister) but he also has another sister named Bertram Savin who is more involved in his care.  Stated she would fax over the guardianship paperwork.  I also requested she fax over the hospital discharge paperwork for me to review.  I had previously discussed this patient's case with Dr. Marletta Lor who stated he does feel we need to at least offer a colonoscopy and upper endoscopy in the setting of iron deficiency anemia. Notably, he would also need this to evaluate for rectal bleeding.  I will discuss this with patient's sisters.  Plan: Obtain C. difficile and GI pathogen panel.  Angie, please fax orders to [facility]. Start Anusol cream twice daily x10 days to help with possible hemorrhoids. Obtain abdominal ultrasound to evaluate for cirrhosis. Dx: Thrombocytopenia. [facility] Clinical Pool please arrange.  I will reach out to family to discuss procedures.

## 2020-07-20 NOTE — Addendum Note (Signed)
Addended by: Armstead Peaks on: 07/20/2020 01:05 PM   Modules accepted: Orders

## 2020-07-20 NOTE — Telephone Encounter (Signed)
Labs entered and faxed accordingly.

## 2020-07-20 NOTE — Telephone Encounter (Signed)
appt scheduled and faxed to [facility] at [phone]

## 2020-07-23 NOTE — Telephone Encounter (Signed)
Received guardianship paperwork stating [name] as guardian.  I tried to call the phone number listed in patient's chart.  A male answered the phone stating that he did not know anyone by the name of Zachary Blair.  Patient's nurse at [facility] [place], gave me a different number for Zachary Blair, [phone]. I called this number.  Received voicemail stating this was the voice mail box of "Zachary Blair".  I left a voicemail requesting return call.

## 2020-07-25 ENCOUNTER — Ambulatory Visit (HOSPITAL_COMMUNITY)
Admission: RE | Admit: 2020-07-25 | Discharge: 2020-07-25 | Disposition: A | Discharge status: Home / Self Care | Payer: Medicare Other | Source: Ambulatory Visit | Attending: Gastroenterology | Admitting: Gastroenterology

## 2020-07-25 ENCOUNTER — Other Ambulatory Visit: Payer: Self-pay

## 2020-07-25 DIAGNOSIS — D696 Thrombocytopenia, unspecified: Secondary | ICD-10-CM | POA: Diagnosis present

## 2020-07-31 NOTE — Telephone Encounter (Signed)
I spoke with Zachary Blair today to discuss the possibility of TCS/EGD for further evaluation of IDA and rectal bleeding. She is going to talk with her other siblings and call our office back with their decision. I have also updated her on [place] result and plans for stool testing to rule out infectious etiologies.

## 2020-07-31 NOTE — Telephone Encounter (Signed)
Noted  

## 2020-07-31 NOTE — Telephone Encounter (Signed)
Spoke with [employer]. The physician is coming to [facility] tomorrow and will collect the stool studies then per the director.

## 2020-07-31 NOTE — Telephone Encounter (Signed)
Can we follow-up on stool studies? I do not think we have received any results as of yet. Orders were faxed to [facility] 9/17.

## 2020-08-15 NOTE — Telephone Encounter (Signed)
Lmom for Zachary Blair, waiting on a return call. Spoke  with pts nurse and she said stool test was completed. Results came back positive for Cdiff. Their fax machine is down and I was asked to call back tomorrow. The nurse wasn't sure if the doctor started pt on antibiotics.

## 2020-08-15 NOTE — Telephone Encounter (Signed)
Helmut Muster, I still have not received stool study results. Can we follow-up on this?   Can we also reach out to patients sister Demontrez Rindfleisch to see if she has made a decision on TCS/EGD? Number for Jaxxen Voong is [phone].

## 2020-08-16 NOTE — Telephone Encounter (Signed)
Noted. He will definitely need to be treated, but we will need to know if the provider there has treated him already. The nurse should be able to look back through his current and previous medication to see if he is or has been treated. They should also be able to reach out to the provider if they are not sure.   Please let me know what you find out today.

## 2020-08-16 NOTE — Telephone Encounter (Signed)
Spoke with Zachary Blair. She is going to try to fax positive Cdiff results and if the fax doesn't work at their facility, she's going to email results. Pt has started Metronidazole q8 hours x 10 days to treat Cdiff. Zachary Memos, PA is aware of this.

## 2020-08-28 NOTE — Progress Notes (Signed)
Appointment was cancelled as patient's sister did not come to the appointment. Plans were to discuss scheduling TCS/EGD for IDA. Due to patient's dementia, he is not able to communicate with me and his sister, Virlan Kempker [facility]), was to be at the appointment to discuss whether she would like for patient to have procedures. Patient presented only with transportation driver today.   Advised that patient's sister could reschedule an appointment or possibility of can be discussed over the phone. This information was sent back to [facility] with patient.   We are reaching out to [facility] to follow-up on diarrhea/C. Diff eradication as patient was found to have C. diff recently. See separate telephone note documentation.

## 2020-08-29 ENCOUNTER — Other Ambulatory Visit: Payer: Self-pay

## 2020-08-29 ENCOUNTER — Telehealth: Payer: Self-pay

## 2020-08-29 ENCOUNTER — Encounter: Payer: Self-pay | Admitting: Gastroenterology

## 2020-08-29 ENCOUNTER — Ambulatory Visit (INDEPENDENT_AMBULATORY_CARE_PROVIDER_SITE_OTHER): Payer: Medicare Other | Admitting: Gastroenterology

## 2020-08-29 VITALS — BP 93/62 | HR 70 | Temp 96.9°F | Ht 65.0 in | Wt 104.4 lb

## 2020-08-29 DIAGNOSIS — D649 Anemia, unspecified: Secondary | ICD-10-CM

## 2020-08-29 NOTE — Telephone Encounter (Signed)
Great. Thank you.

## 2020-08-29 NOTE — Telephone Encounter (Signed)
Spoke with the receptionist at nursing facility. The receptionist took a message for their office manager. She will fax results notes/treatment noted for pt. Spoke with Toniann Fail the pts nurse. Per Toniann Fail, she states that she hasn't seen any diarrhea with pt and it improved. Awaiting results and treatment notes.

## 2021-02-14 IMAGING — CT CT HEAD W/O CM
5 of 10 series · 15 of 47 positions shown, 16 images · non-contrast
Comparison: None.

CLINICAL DATA: Left-sided weakness

EXAM:
CT HEAD WITHOUT CONTRAST
TECHNIQUE: Contiguous axial images were obtained from the base of the skull
through the vertex without intravenous contrast.

[Series 2: head w o · axial · 0.53mm/px · z∈[+7,+72]mm · 2 of 39 slices shown, 3 images]
[im 13/39  brain]
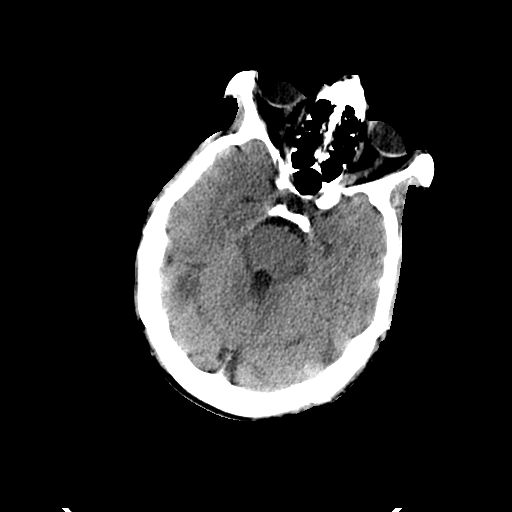
[im 13/39  bone]
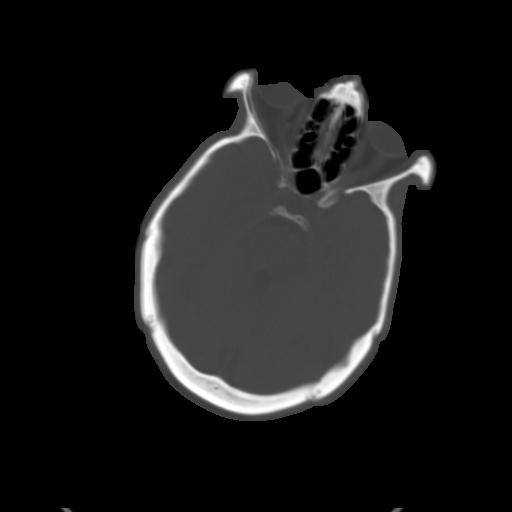
[im 26/39  brain]
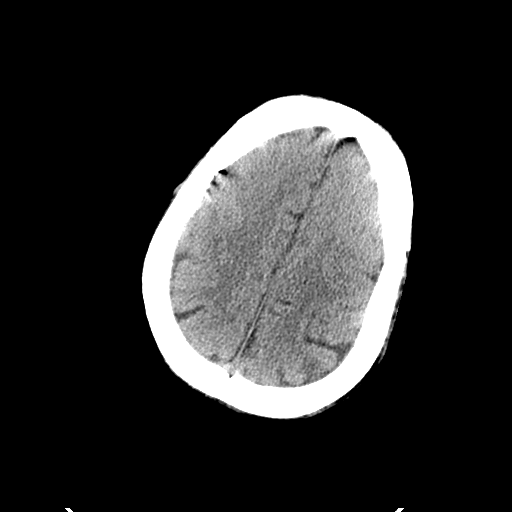

[Series 4: coronal soft · coronal · 0.32mm/px · 3 of 71 slices shown]
[im 18/71  brain]
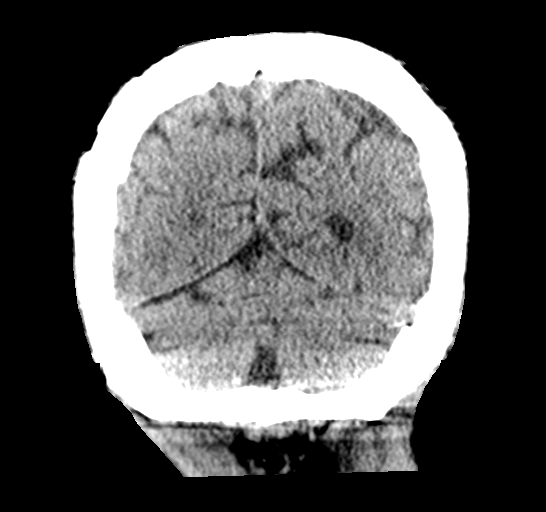
[im 36/71  brain]
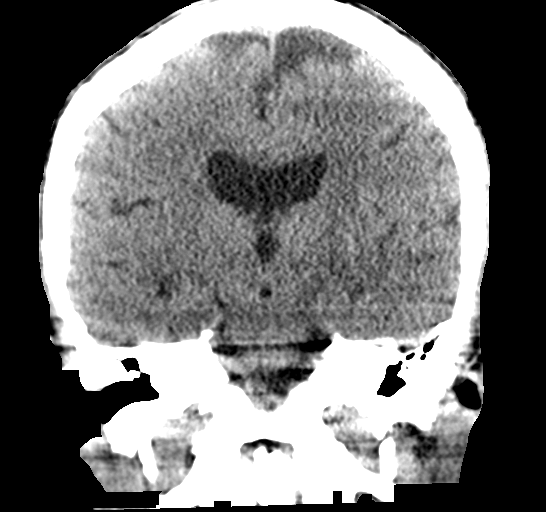
[im 53/71  brain]
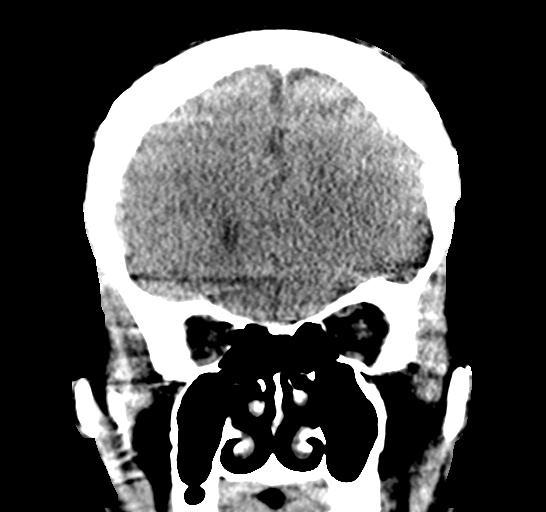

[Series 9: sagittal soft · sagittal · 0.29mm/px · 3 of 58 slices shown]
[im 15/58  brain]
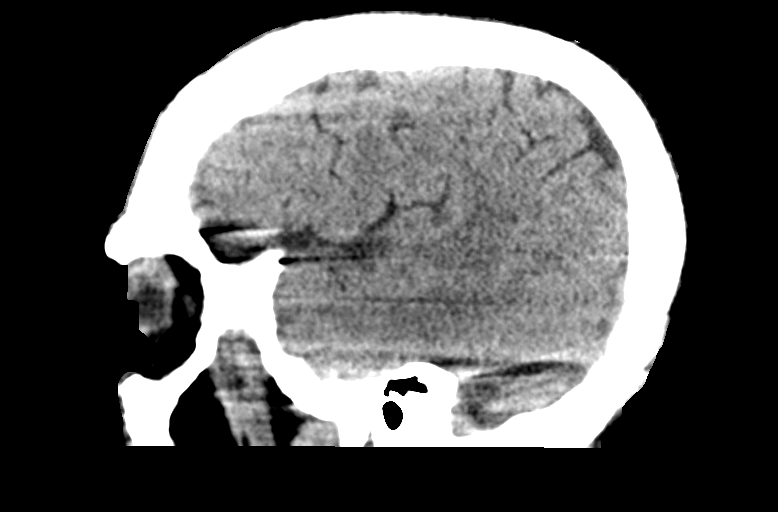
[im 29/58  brain]
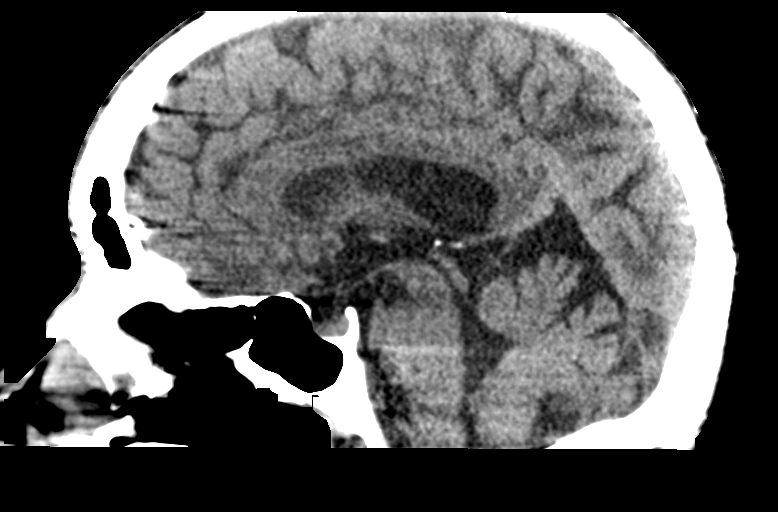
[im 43/58  brain]
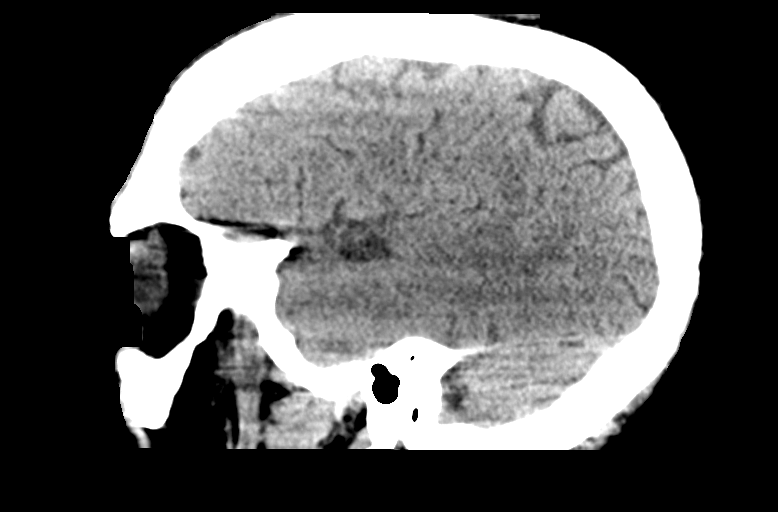

[Series 10: head ax w o · axial · 0.34mm/px · z∈[+18,+71]mm · 2 of 35 slices shown]
[im 12/35  brain]
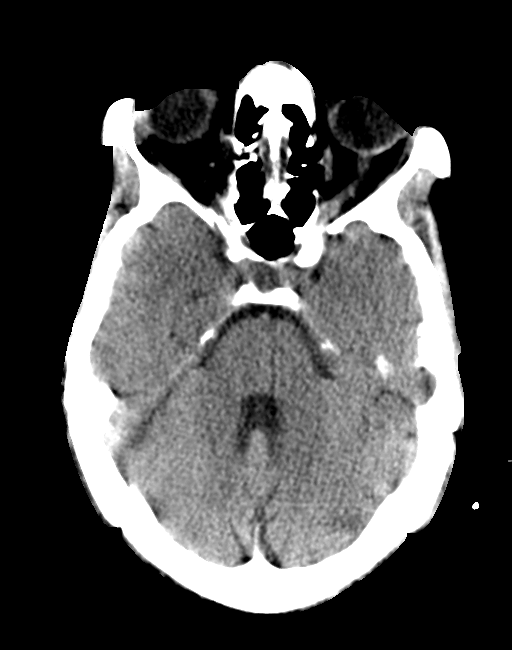
[im 23/35  brain]
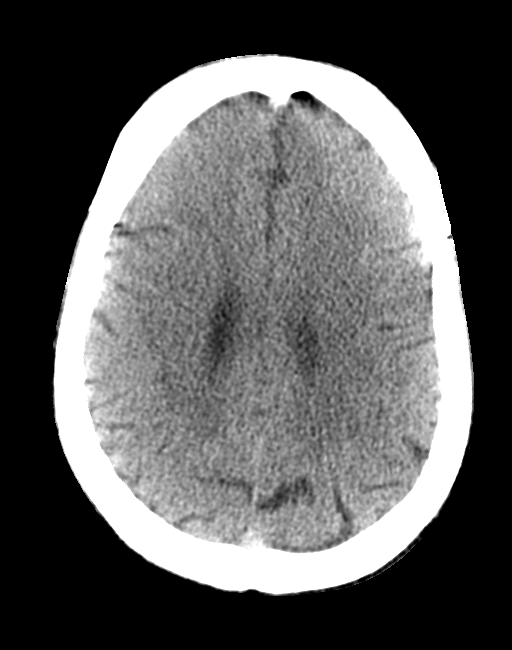

[Series 11: head ax bone · axial · 0.34mm/px · z∈[-21,+63]mm · 5 of 86 slices shown]
[im 11/86  bone]
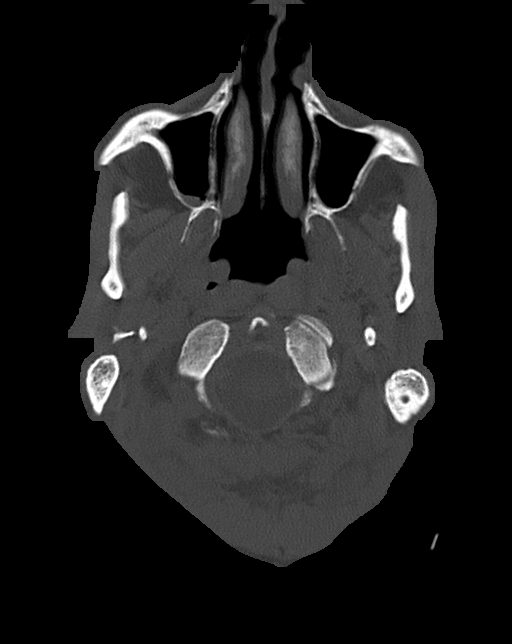
[im 22/86  bone]
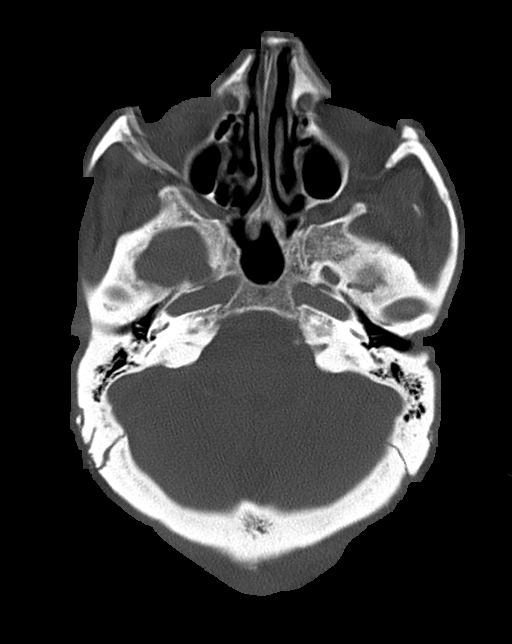
[im 32/86  bone]
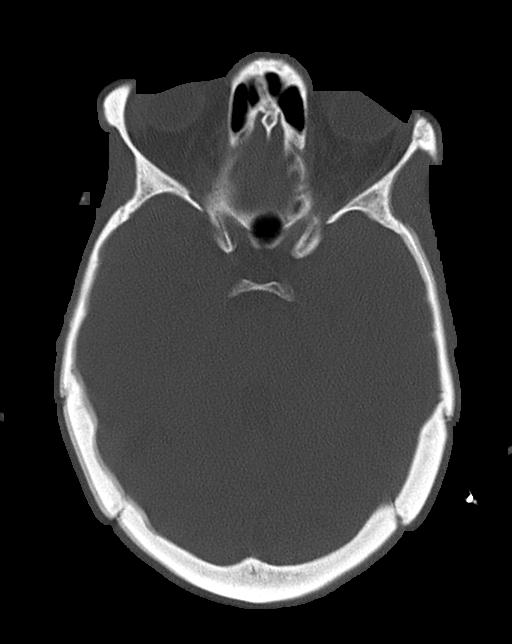
[im 43/86  bone]
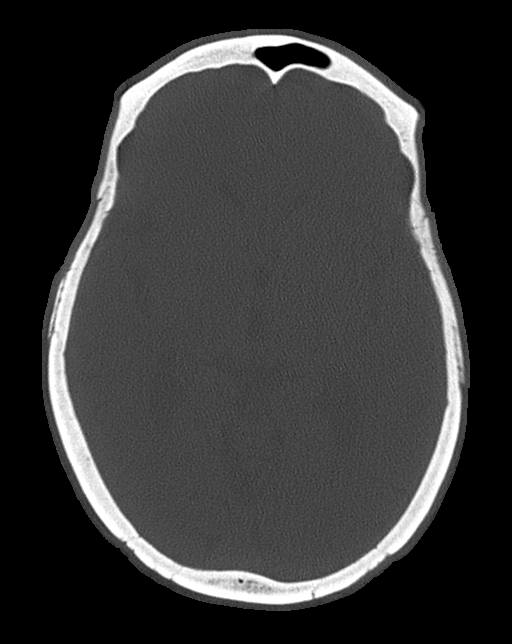
[im 54/86  bone]
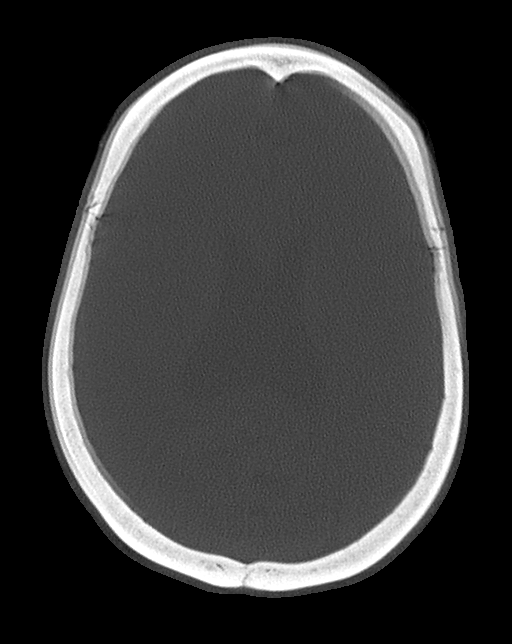

[15 of 47 positions shown; findings below may reference images not displayed]

FINDINGS: Brain: No evidence of acute infarction, hemorrhage, hydrocephalus,
extra-axial collection or mass lesion/mass effect.

Vascular: No hyperdense vessel or unexpected calcification.

Skull: Normal. Negative for fracture or focal lesion.

Sinuses/Orbits: No acute finding.

Other: None.
IMPRESSION: Normal head CT for age

## 2021-02-14 IMAGING — MR MR HEAD W/O CM
9 series · 48 of 48 positions shown · non-contrast
Comparison: Head CT December 16, 2019

CLINICAL DATA: Altered mental status

EXAM:
MRI HEAD WITHOUT CONTRAST
TECHNIQUE: Multiplanar, multiecho pulse sequences of the brain and surrounding
structures were obtained without intravenous contrast.

[Series 3: T1 · sagittal · 5.0mm · 0.45mm/px · 2 of 20 slices shown]
[im 1/20]
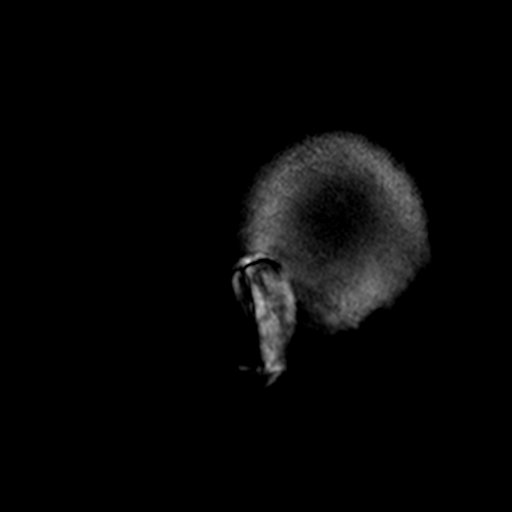
[im 20/20]
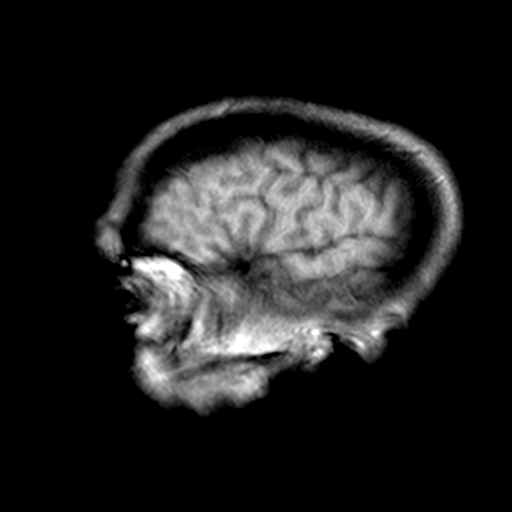

[Series 4: DWI · axial · 3.0mm · 0.82mm/px · z∈[-155,+7]mm · 8 of 55 slices shown (1 of 3)]
[im 1/55]
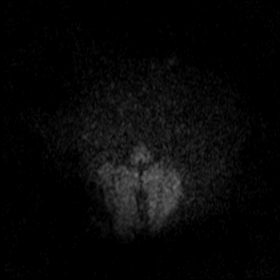
[im 8/55]
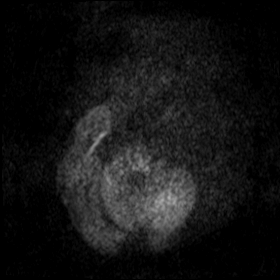
[im 16/55]
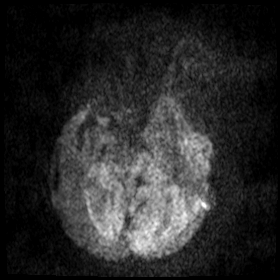
[im 24/55]
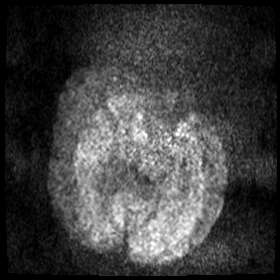
[im 31/55]
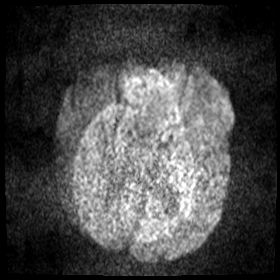
[im 39/55]
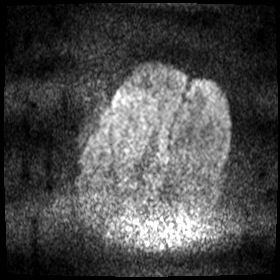
[im 47/55]
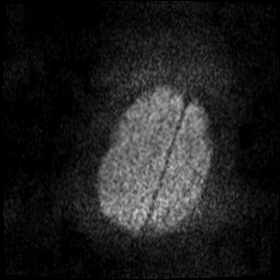
[im 55/55]
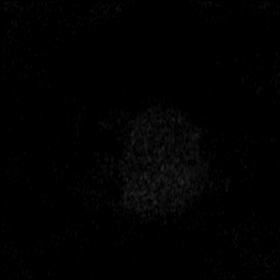

[Series 5: ax dwi_adc · axial · 3.0mm · 0.82mm/px · z∈[-155,+4]mm · 7 of 54 slices shown (1 of 2)]
[im 1/54]
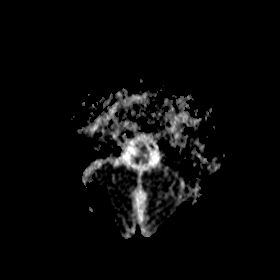
[im 9/54]
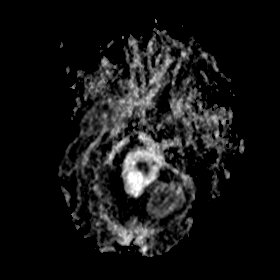
[im 18/54]
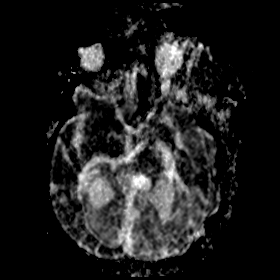
[im 27/54]
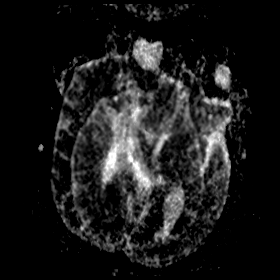
[im 36/54]
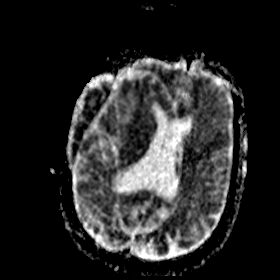
[im 45/54]
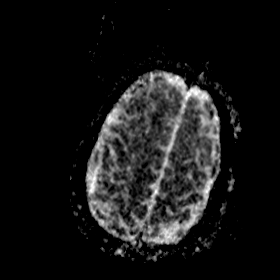
[im 54/54]
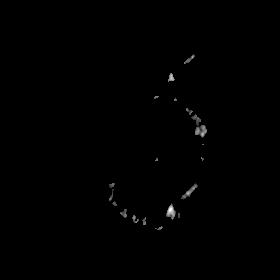

[Series 8: DWI · axial · 3.0mm · 0.82mm/px · z∈[-47,+111]mm · 8 of 55 slices shown (2 of 3)]
[im 1/55]
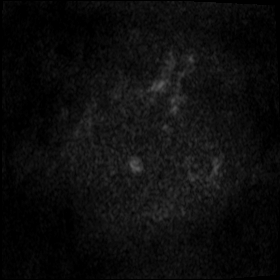
[im 8/55]
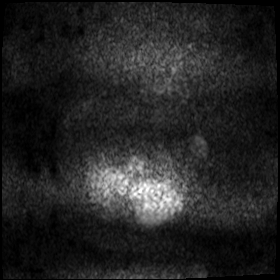
[im 16/55]
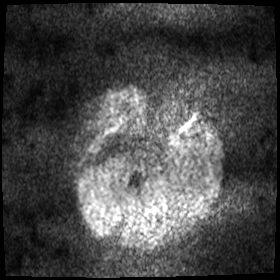
[im 24/55]
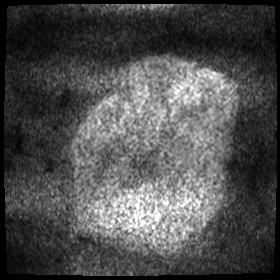
[im 31/55]
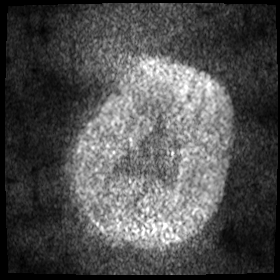
[im 39/55]
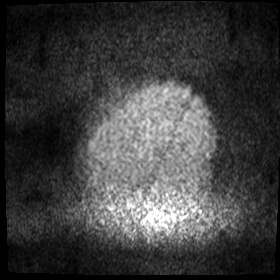
[im 47/55]
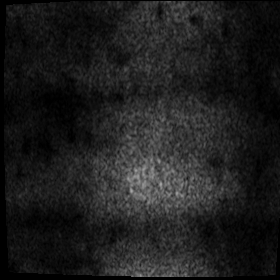
[im 55/55]
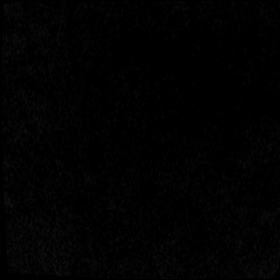

[Series 9: ax dwi_adc · axial · 3.0mm · 0.82mm/px · z∈[-47,+103]mm · 7 of 52 slices shown (2 of 2)]
[im 1/52]
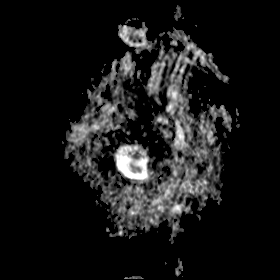
[im 9/52]
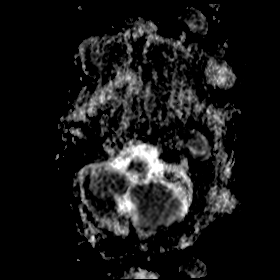
[im 18/52]
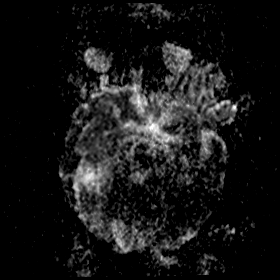
[im 26/52]
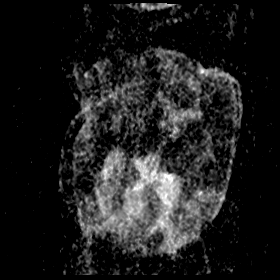
[im 35/52]
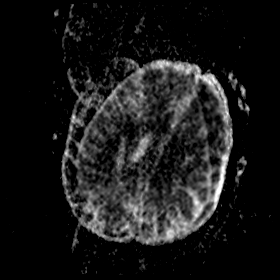
[im 43/52]
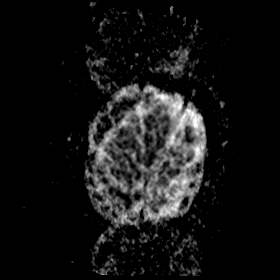
[im 52/52]
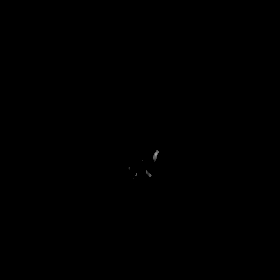

[Series 10: DWI · coronal · 5.0mm · 0.51mm/px · 5 of 34 slices shown (3 of 3)]
[im 1/34]
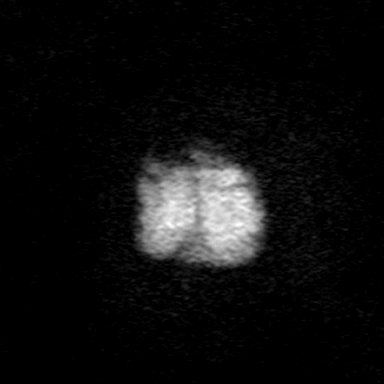
[im 9/34]
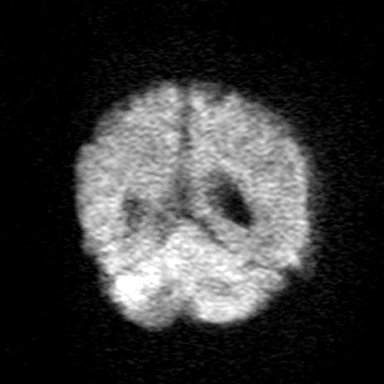
[im 17/34]
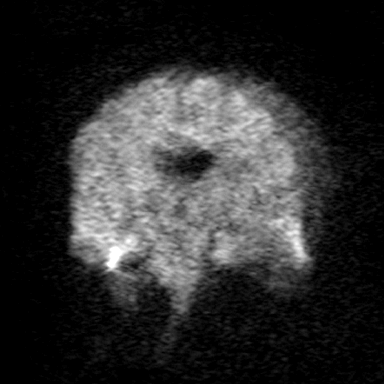
[im 25/34]
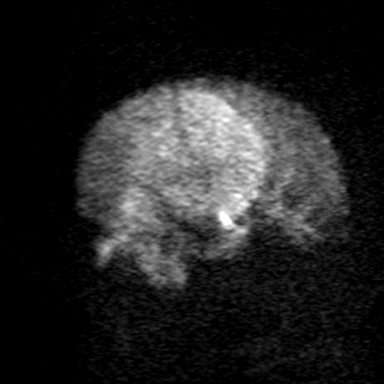
[im 34/34]
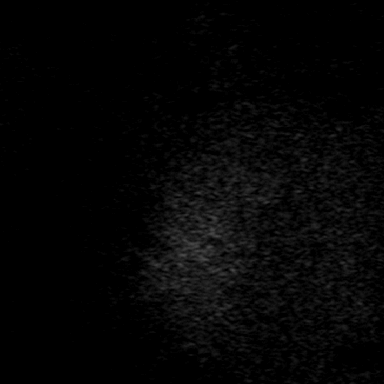

[Series 11: cor dwi_adc · coronal · 5.0mm · 0.51mm/px · 5 of 34 slices shown]
[im 1/34]
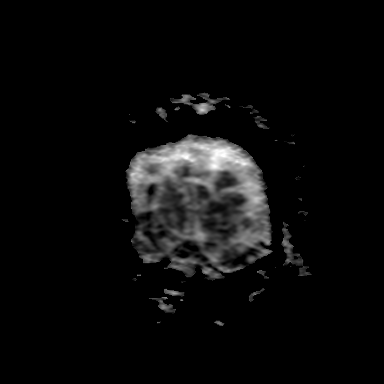
[im 9/34]
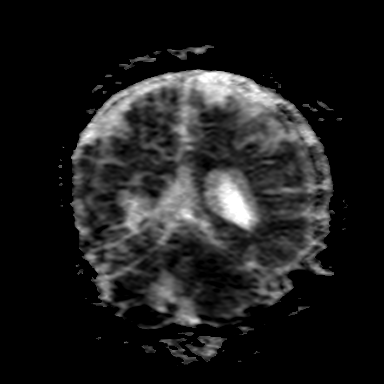
[im 17/34]
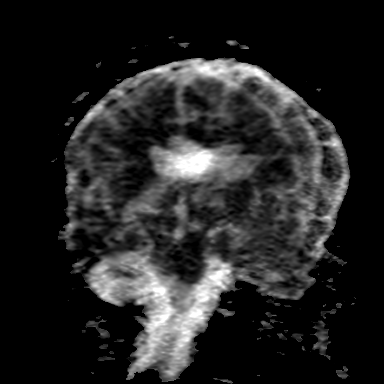
[im 25/34]
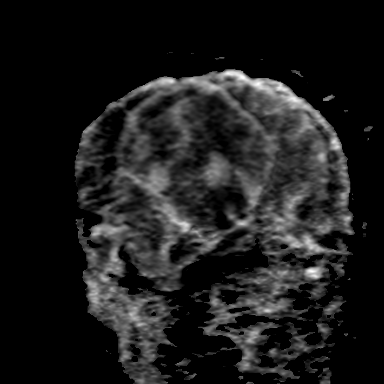
[im 34/34]
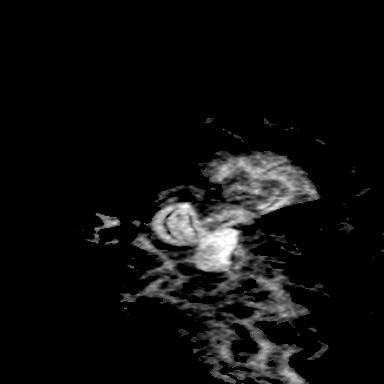

[Series 12: T2 · axial · 5.0mm · 0.72mm/px · z∈[-29,+99]mm · 3 of 21 slices shown]
[im 1/21]
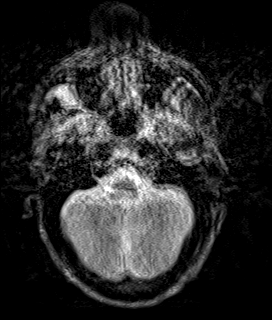
[im 11/21]
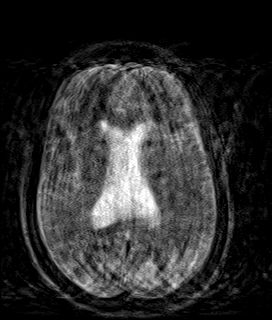
[im 21/21]
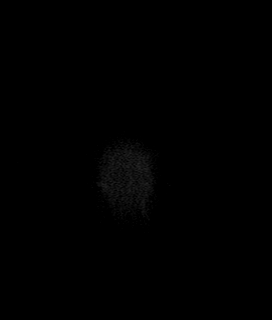

[Series 13: FLAIR · axial · 5.0mm · 1.26mm/px · z∈[-38,+102]mm · 3 of 23 slices shown]
[im 1/23]
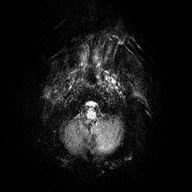
[im 12/23]
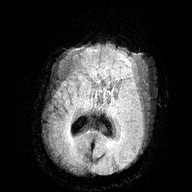
[im 23/23]
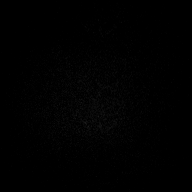

[48 of 48 positions shown; findings below may reference images not displayed]

FINDINGS: Essentially nondiagnostic images of the brain were obtained due to
patient inability to lay still in the scanner. No large intracranial
mass, hemorrhage or midline shift. Repeat scan under sedation is
suggested if clinically appropriate.
IMPRESSION: Essentially nondiagnostic images of the brain due to patient
inability to lay still in the scanner.
